# Patient Record
Sex: Female | Born: 1995 | Race: Black or African American | Hispanic: No | Marital: Single | Smoking: Current every day smoker
Health system: Southern US, Community
[De-identification: ages and names within clinical notes are randomized; demographics above are authoritative.]

---

## 2014-12-26 ENCOUNTER — Ambulatory Visit (HOSPITAL_BASED_OUTPATIENT_CLINIC_OR_DEPARTMENT_OTHER): Payer: Medicaid Other | Attending: *Deleted

## 2017-06-24 ENCOUNTER — Encounter (HOSPITAL_BASED_OUTPATIENT_CLINIC_OR_DEPARTMENT_OTHER): Payer: Self-pay

## 2017-06-24 ENCOUNTER — Emergency Department (HOSPITAL_BASED_OUTPATIENT_CLINIC_OR_DEPARTMENT_OTHER)
Admission: EM | Admit: 2017-06-24 | Discharge: 2017-06-24 | Disposition: A | Discharge status: Home / Self Care | Payer: Medicaid Other | Attending: Emergency Medicine | Admitting: Emergency Medicine

## 2017-06-24 ENCOUNTER — Emergency Department (HOSPITAL_BASED_OUTPATIENT_CLINIC_OR_DEPARTMENT_OTHER): Payer: Medicaid Other

## 2017-06-24 DIAGNOSIS — F17200 Nicotine dependence, unspecified, uncomplicated: Secondary | ICD-10-CM | POA: Insufficient documentation

## 2017-06-24 DIAGNOSIS — R51 Headache: Secondary | ICD-10-CM | POA: Insufficient documentation

## 2017-06-24 DIAGNOSIS — R519 Headache, unspecified: Secondary | ICD-10-CM

## 2017-06-24 LAB — BASIC METABOLIC PANEL
Anion gap: 9 (ref 5–15)
BUN: 13 mg/dL (ref 6–20)
CALCIUM: 9.1 mg/dL (ref 8.9–10.3)
CO2: 26 mmol/L (ref 22–32)
CREATININE: 0.94 mg/dL (ref 0.44–1.00)
Chloride: 104 mmol/L (ref 101–111)
GFR calc Af Amer: >60 mL/min (ref >60)
GLUCOSE: 90 mg/dL (ref 65–99)
Potassium: 3.8 mmol/L (ref 3.5–5.1)
Sodium: 139 mmol/L (ref 135–145)

## 2017-06-24 LAB — CBC WITH DIFFERENTIAL/PLATELET
BASOS ABS: 0 10*3/uL (ref 0.0–0.1)
Basophils Relative: 0 %
EOS ABS: 0.2 10*3/uL (ref 0.0–0.7)
EOS PCT: 2 %
HCT: 37.7 % (ref 36.0–46.0)
Hemoglobin: 13.2 g/dL (ref 12.0–15.0)
LYMPHS ABS: 3.7 10*3/uL (ref 0.7–4.0)
LYMPHS PCT: 49 %
MCH: 33 pg (ref 26.0–34.0)
MCHC: 35 g/dL (ref 30.0–36.0)
MCV: 94.3 fL (ref 78.0–100.0)
MONO ABS: 0.9 10*3/uL (ref 0.1–1.0)
Monocytes Relative: 12 %
Neutro Abs: 2.8 10*3/uL (ref 1.7–7.7)
Neutrophils Relative %: 37 %
PLATELETS: 340 10*3/uL (ref 150–400)
RBC: 4 MIL/uL (ref 3.87–5.11)
RDW: 12.3 % (ref 11.5–15.5)
WBC: 7.5 10*3/uL (ref 4.0–10.5)

## 2017-06-24 LAB — PREGNANCY, URINE: PREG TEST UR: NEGATIVE

## 2017-06-24 MED ORDER — SODIUM CHLORIDE 0.9 % IV BOLUS (SEPSIS)
1000.0000 mL | Freq: Once | INTRAVENOUS | Status: AC
  Administered 2017-06-24: 1000 mL via INTRAVENOUS

## 2017-06-24 MED ORDER — ACETAMINOPHEN 325 MG PO TABS
650.0000 mg | ORAL_TABLET | Freq: Once | ORAL | Status: AC
  Administered 2017-06-24: 650 mg via ORAL
  Filled 2017-06-24: qty 2

## 2017-06-24 MED ORDER — PROCHLORPERAZINE EDISYLATE 5 MG/ML IJ SOLN
10.0000 mg | Freq: Once | INTRAMUSCULAR | Status: AC
  Administered 2017-06-24: 10 mg via INTRAVENOUS
  Filled 2017-06-24: qty 2

## 2017-06-24 MED ORDER — DEXAMETHASONE SODIUM PHOSPHATE 10 MG/ML IJ SOLN
10.0000 mg | Freq: Once | INTRAMUSCULAR | Status: AC
  Administered 2017-06-24: 10 mg via INTRAVENOUS
  Filled 2017-06-24: qty 1

## 2017-06-24 MED ORDER — DIPHENHYDRAMINE HCL 50 MG/ML IJ SOLN
25.0000 mg | Freq: Once | INTRAMUSCULAR | Status: AC
  Administered 2017-06-24: 25 mg via INTRAVENOUS
  Filled 2017-06-24: qty 1

## 2017-06-24 NOTE — ED Notes (Signed)
ED Provider at bedside. 

## 2017-06-24 NOTE — ED Notes (Signed)
Patient claimed that she has a migraine.

## 2017-06-24 NOTE — Discharge Instructions (Signed)
Take ibuprofen and tylenol for headache. Return for worsening symptoms, including worsening pain, confusion, new vision or speech changes, difficulty walking, or any other symptoms concerning to you.

## 2017-06-24 NOTE — ED Provider Notes (Signed)
[facility] Provider Note   CSN: [id number] Arrival date & time: 06/24/17  1817   By signing my name below, I, Clarisse GougeXavier Herndon, attest that this documentation has been prepared under the direction and in the presence of Verdie MosherLiu, Neysa Bonitoana Duo, MD. Electronically signed, Clarisse GougeXavier Herndon, ED Scribe. 06/24/17. 7:02 PM.   History   Chief Complaint Chief Complaint  Patient presents with  . Headache   The history is provided by the patient and medical records. No language interpreter was used.    Cassandra Hall is a 21 y.o. female presenting to the Emergency Department concerning constant headaches x 3 weeks. Associated decreased appetite, decreased restful sleep, N/V, photophobia. Pt describes all over headache that is only present on the left sometimes, 7/10 in intensity. Pt states her pain is the same all day, though sometimes the pain has lasted all day and on some days the pain has been intermittently. No PTA medications. She reports h/o "regular headaches" in the past, stating they have been all over the head. H/o environmental allergies noted. No trauma, injury, LOC. No recent illnesses. No fever, phonophobia, speech difficulty. No other complaints at this time.   History reviewed. No pertinent past medical history.  There are no active problems to display for this patient.   History reviewed. No pertinent surgical history.  OB History    No data available       Home Medications    Prior to Admission medications   Not on File    Family History No family history on file.  Social History Social History  Substance Use Topics  . Smoking status: Current Every Day Smoker  . Smokeless tobacco: Never Used  . Alcohol use Yes     Comment: occ     Allergies   Patient has no known allergies.   Review of Systems Review of Systems  Constitutional: Negative for fever.  HENT: Negative for facial swelling.   Eyes: Positive for photophobia. Negative for visual  disturbance.  Gastrointestinal: Positive for nausea and vomiting. Negative for abdominal pain.  Neurological: Positive for headaches. Negative for dizziness, syncope, speech difficulty, weakness and numbness.     Physical Exam Updated Vital Signs BP 136/90 (BP Location: Left Arm)   Pulse 69   Temp 98.3 F (36.8 C) (Oral)   Resp 16   Ht 5\' 7"  (1.702 m)   Wt 244 lb (110.7 kg)   LMP 06/22/2017   SpO2 99%   BMI 38.22 kg/m   Physical Exam Physical Exam  Nursing note and vitals reviewed. Constitutional: Well developed, well nourished, non-toxic, and in no acute distress Head: Normocephalic and atraumatic.  Mouth/Throat: Oropharynx is clear and moist.  Neck: Normal range of motion. Neck supple.  no nuchal rigidity Cardiovascular: Normal rate and regular rhythm.   Pulmonary/Chest: Effort normal and breath sounds normal.  Abdominal: Soft. There is no tenderness. There is no rebound and no guarding.  Musculoskeletal: Normal range of motion.  Neurological: Neurological:  Alert, oriented to person, place, time, and situation. Memory grossly in tact. Fluent speech. No dysarthria or aphasia.  Cranial nerves: VF are full. Pupils are symmetric, and reactive to light. EOMI without nystagmus. No gaze deviation. Facial muscles symmetric with activation. Sensation to light touch over face in tact bilaterally. Hearing grossly in tact. Palate elevates symmetrically. Head turn and shoulder shrug are intact. Tongue midline.  Reflexes defered.  Muscle bulk and tone normal. No pronator drift. Moves all extremities symmetrically. Sensation to light touch is in tact  throughout in bilateral upper and lower extremities. Coordination reveals no dysmetria with finger to nose. Gait is narrow-based and steady. Non-ataxic.  Skin: Skin is warm and dry.  Psychiatric: Cooperative   ED Treatments / Results  DIAGNOSTIC STUDIES: Oxygen Saturation is 99% on RA, NL by my interpretation.    COORDINATION OF  CARE: 6:51 PM-Discussed next steps with pt. Pt verbalized understanding and is agreeable with the plan. Will order IV, migraine cocktail and testing.   Labs (all labs ordered are listed, but only abnormal results are displayed) Labs Reviewed  PREGNANCY, URINE  CBC WITH DIFFERENTIAL/PLATELET  BASIC METABOLIC PANEL    EKG  EKG Interpretation None       Radiology Ct Head Wo Contrast  Result Date: 06/24/2017 CLINICAL DATA:  Atypical headaches for 3 weeks with intermittent nausea and vomiting. No reported injury. EXAM: CT HEAD WITHOUT CONTRAST TECHNIQUE: Contiguous axial images were obtained from the base of the skull through the vertex without intravenous contrast. COMPARISON:  None. FINDINGS: Brain: No evidence of parenchymal hemorrhage or extra-axial fluid collection. No mass lesion, mass effect, or midline shift. No CT evidence of acute infarction. Cerebral volume is age appropriate. No ventriculomegaly. Vascular: No hyperdense vessel or unexpected calcification. Skull: No evidence of calvarial fracture. Sinuses/Orbits: The visualized paranasal sinuses are essentially clear. Other:  The mastoid air cells are unopacified. IMPRESSION: Negative head CT.  No evidence of acute intracranial abnormality. Electronically Signed   By: Delbert Phenix M.D.   On: 06/24/2017 19:43    Procedures Procedures (including critical care time)  Medications Ordered in ED Medications  sodium chloride 0.9 % bolus 1,000 mL (1,000 mLs Intravenous New Bag/Given 06/24/17 1930)  prochlorperazine (COMPAZINE) injection 10 mg (10 mg Intravenous Given 06/24/17 1930)  dexamethasone (DECADRON) injection 10 mg (10 mg Intravenous Given 06/24/17 1930)  diphenhydrAMINE (BENADRYL) injection 25 mg (25 mg Intravenous Given 06/24/17 1930)  acetaminophen (TYLENOL) tablet 650 mg (650 mg Oral Given 06/24/17 1930)     Initial Impression / Assessment and Plan / ED Course  I have reviewed the triage vital signs and the nursing  notes.  Pertinent labs & imaging results that were available during my care of the patient were reviewed by me and considered in my medical decision making (see chart for details).     21 year old female who presents with  atypical headache from her usual headaches given worsening severity and duration. She is nontoxic in no acute distress and normal vital signs. With a normal neurological exam. Given atypical headache, did obtain CT head which shows no acute intracranial processes. Blood work reassuring and she is not pregnant. She did receive a migraine cocktail here in the ED, and feels dramatically improved. I do feel that she is stable for discharge home at this time. Findings on exam and by history is not concerning at this time for subarachnoid hemorrhage, meningitis, CVA, or other acute intra cranial process. Strict return and follow-up instructions reviewed. She expressed understanding of all discharge instructions and felt comfortable with the plan of care.   Final Clinical Impressions(s) / ED Diagnoses   Final diagnoses:  Acute nonintractable headache, unspecified headache type    New Prescriptions New Prescriptions   No medications on file   I personally performed the services described in this documentation, which was scribed in my presence. The recorded information has been reviewed and is accurate.    Lavera Guise, MD 06/24/17 2106

## 2017-06-24 NOTE — ED Triage Notes (Signed)
C/o HA x 3 weeks-NAD-steady gait

## 2017-06-24 NOTE — ED Notes (Signed)
Pt states she is unable to obtain a urine sample.

## 2017-12-05 IMAGING — CT CT HEAD W/O CM
3 series · 14 of 46 positions shown, 16 images · non-contrast
Comparison: None.

CLINICAL DATA: Atypical headaches for 3 weeks with intermittent
nausea and vomiting. No reported injury.

EXAM:
CT HEAD WITHOUT CONTRAST
TECHNIQUE: Contiguous axial images were obtained from the base of the skull
through the vertex without intravenous contrast.

[Series 2: head wo · axial · 0.43mm/px · z∈[-173,-53]mm · 8 of 29 slices shown, 10 images]
[im 3/29  brain]
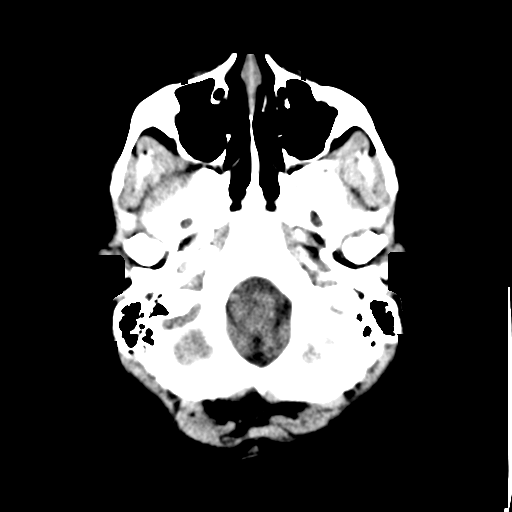
[im 3/29  bone]
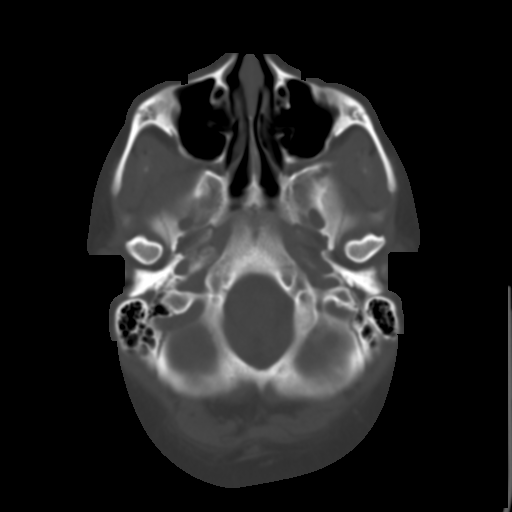
[im 7/29  brain]
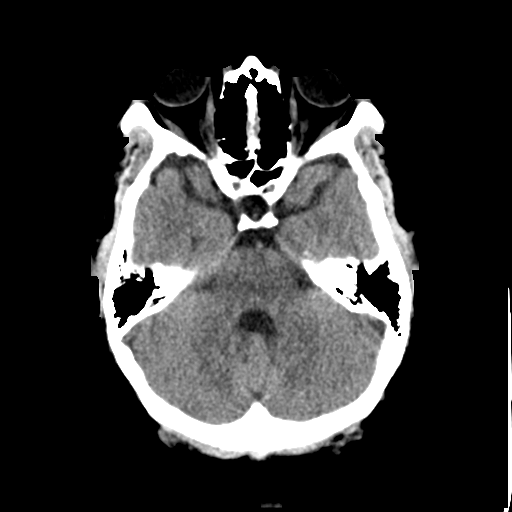
[im 10/29  brain]
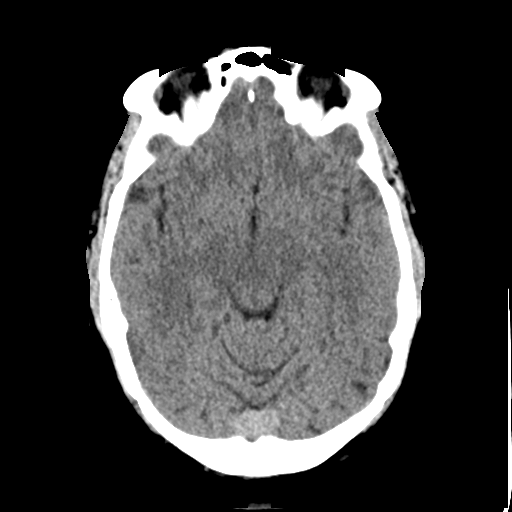
[im 13/29  brain]
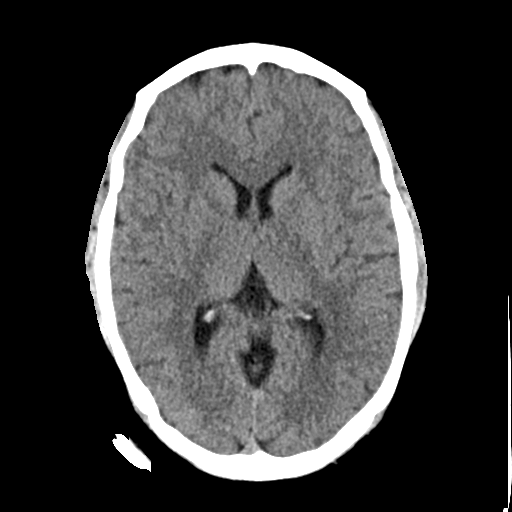
[im 17/29  brain]
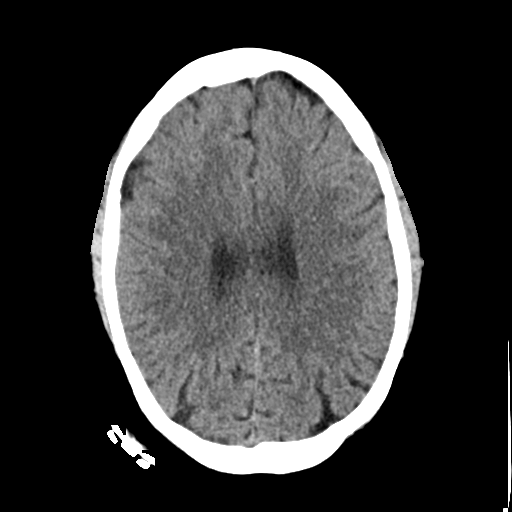
[im 17/29  bone]
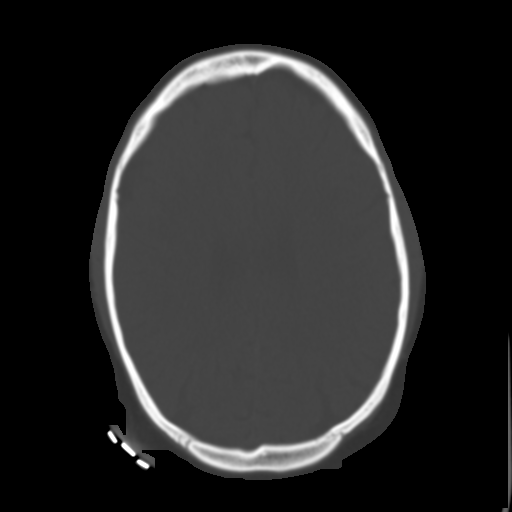
[im 20/29  brain]
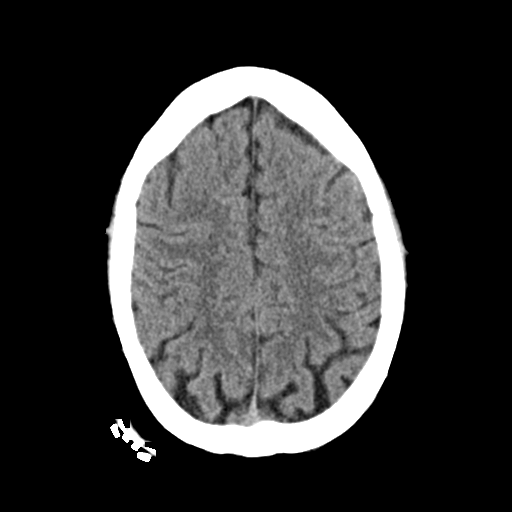
[im 23/29  brain]
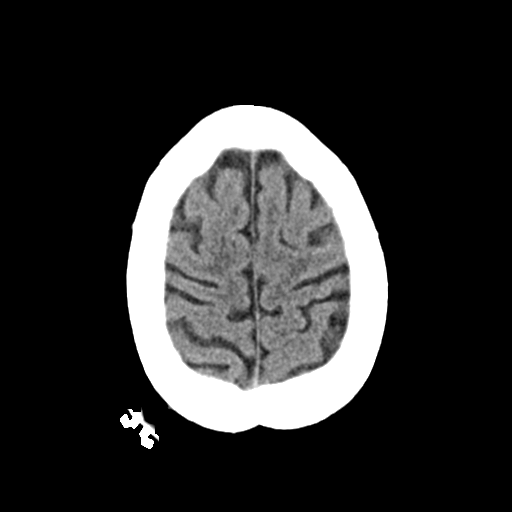
[im 27/29  brain]
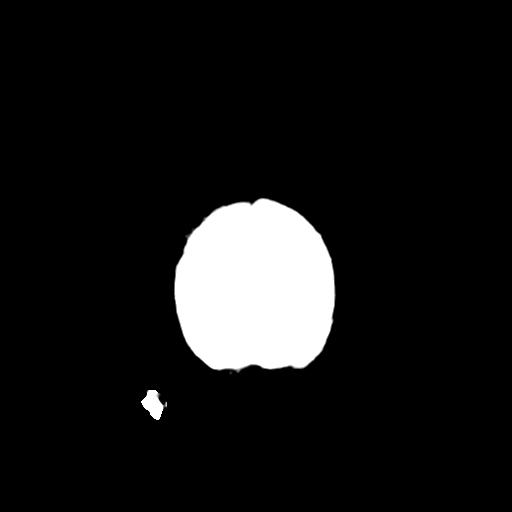

[Series 4: coronal soft · coronal · 0.29mm/px · 3 of 67 slices shown]
[im 23/67  brain]
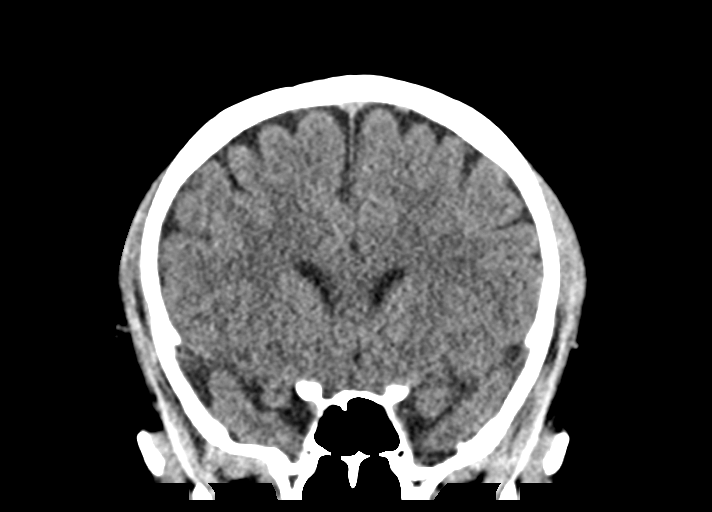
[im 30/67  brain]
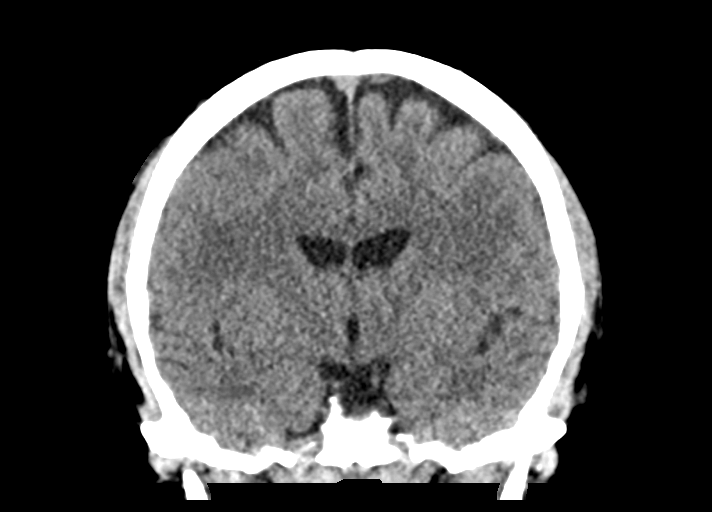
[im 37/67  brain]
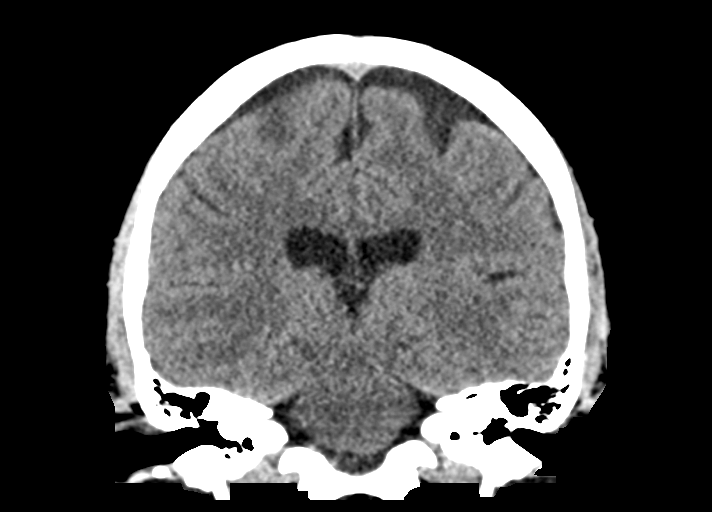

[Series 5: sag soft · sagittal · 0.29mm/px · 3 of 53 slices shown]
[im 18/53  brain]
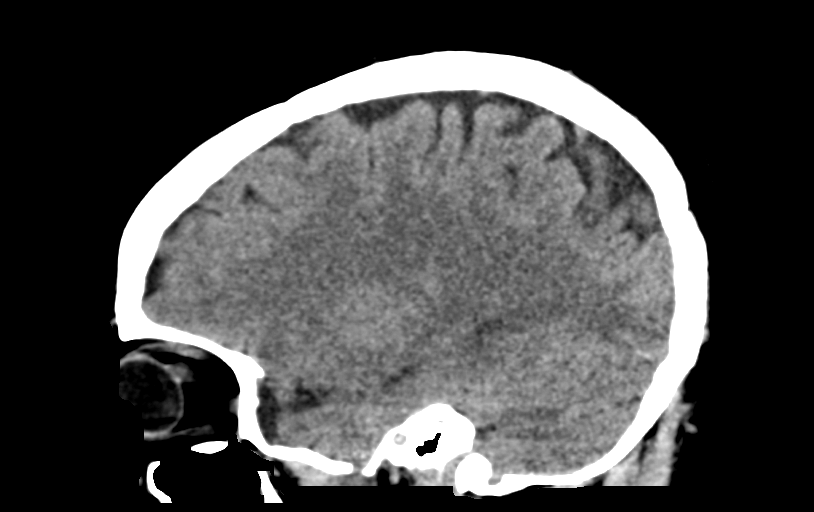
[im 27/53  brain]
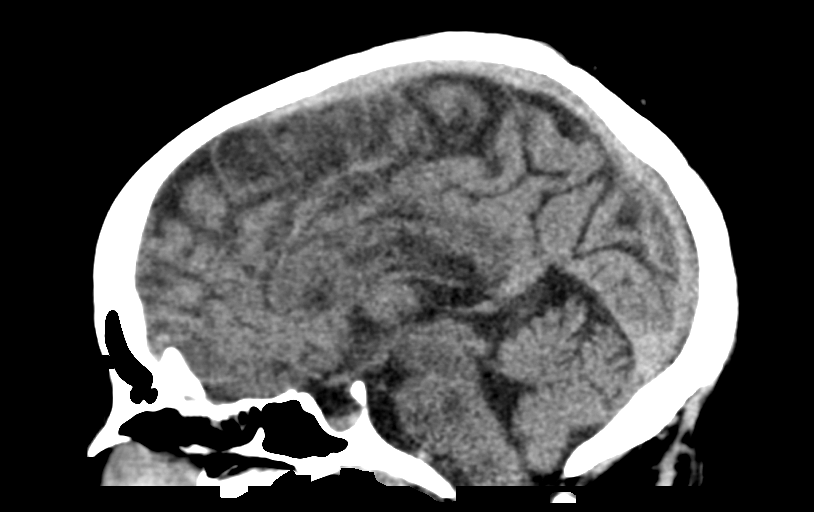
[im 35/53  brain]
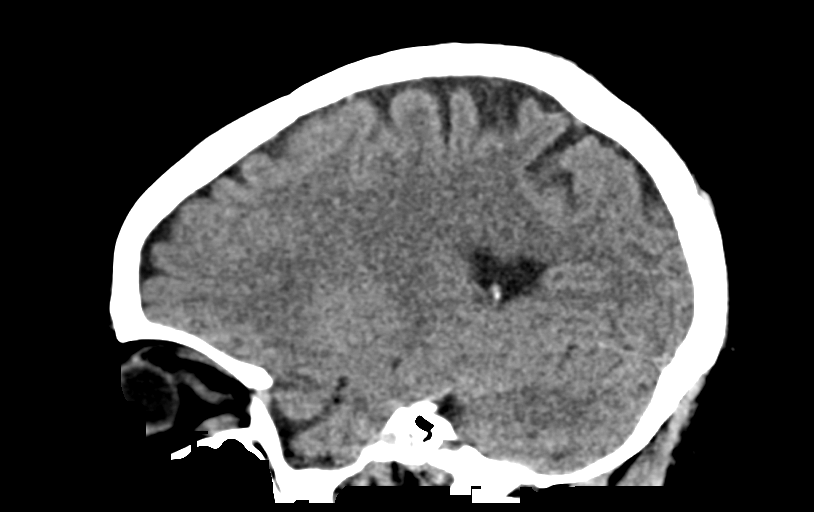

[14 of 46 positions shown; findings below may reference images not displayed]

FINDINGS: Brain: No evidence of parenchymal hemorrhage or extra-axial fluid
collection. No mass lesion, mass effect, or midline shift. No CT
evidence of acute infarction. Cerebral volume is age appropriate. No
ventriculomegaly.

Vascular: No hyperdense vessel or unexpected calcification.

Skull: No evidence of calvarial fracture.

Sinuses/Orbits: The visualized paranasal sinuses are essentially
clear.

Other:  The mastoid air cells are unopacified.
IMPRESSION: Negative head CT.  No evidence of acute intracranial abnormality.
